# Patient Record
Sex: Female | Born: 2000 | Race: Black or African American | Hispanic: No | Marital: Single | Smoking: Never smoker
Health system: Southern US, Community
[De-identification: ages and names within clinical notes are randomized; demographics above are authoritative.]

## PROBLEM LIST (undated history)

## (undated) DIAGNOSIS — E785 Hyperlipidemia, unspecified: Secondary | ICD-10-CM

## (undated) DIAGNOSIS — F419 Anxiety disorder, unspecified: Secondary | ICD-10-CM

---
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
## 2022-11-17 IMAGING — CR DG CHEST 2V
1 series · 2 of 2 positions shown · non-contrast
Comparison: 01/22/2005

CLINICAL DATA: Chest pain, hematemesis, shortness of breath

EXAM:
CHEST - 2 VIEW

[Series 1: dg chest 2 view · 0.14mm/px · 2 of 2 slices shown]
[im 1/2]
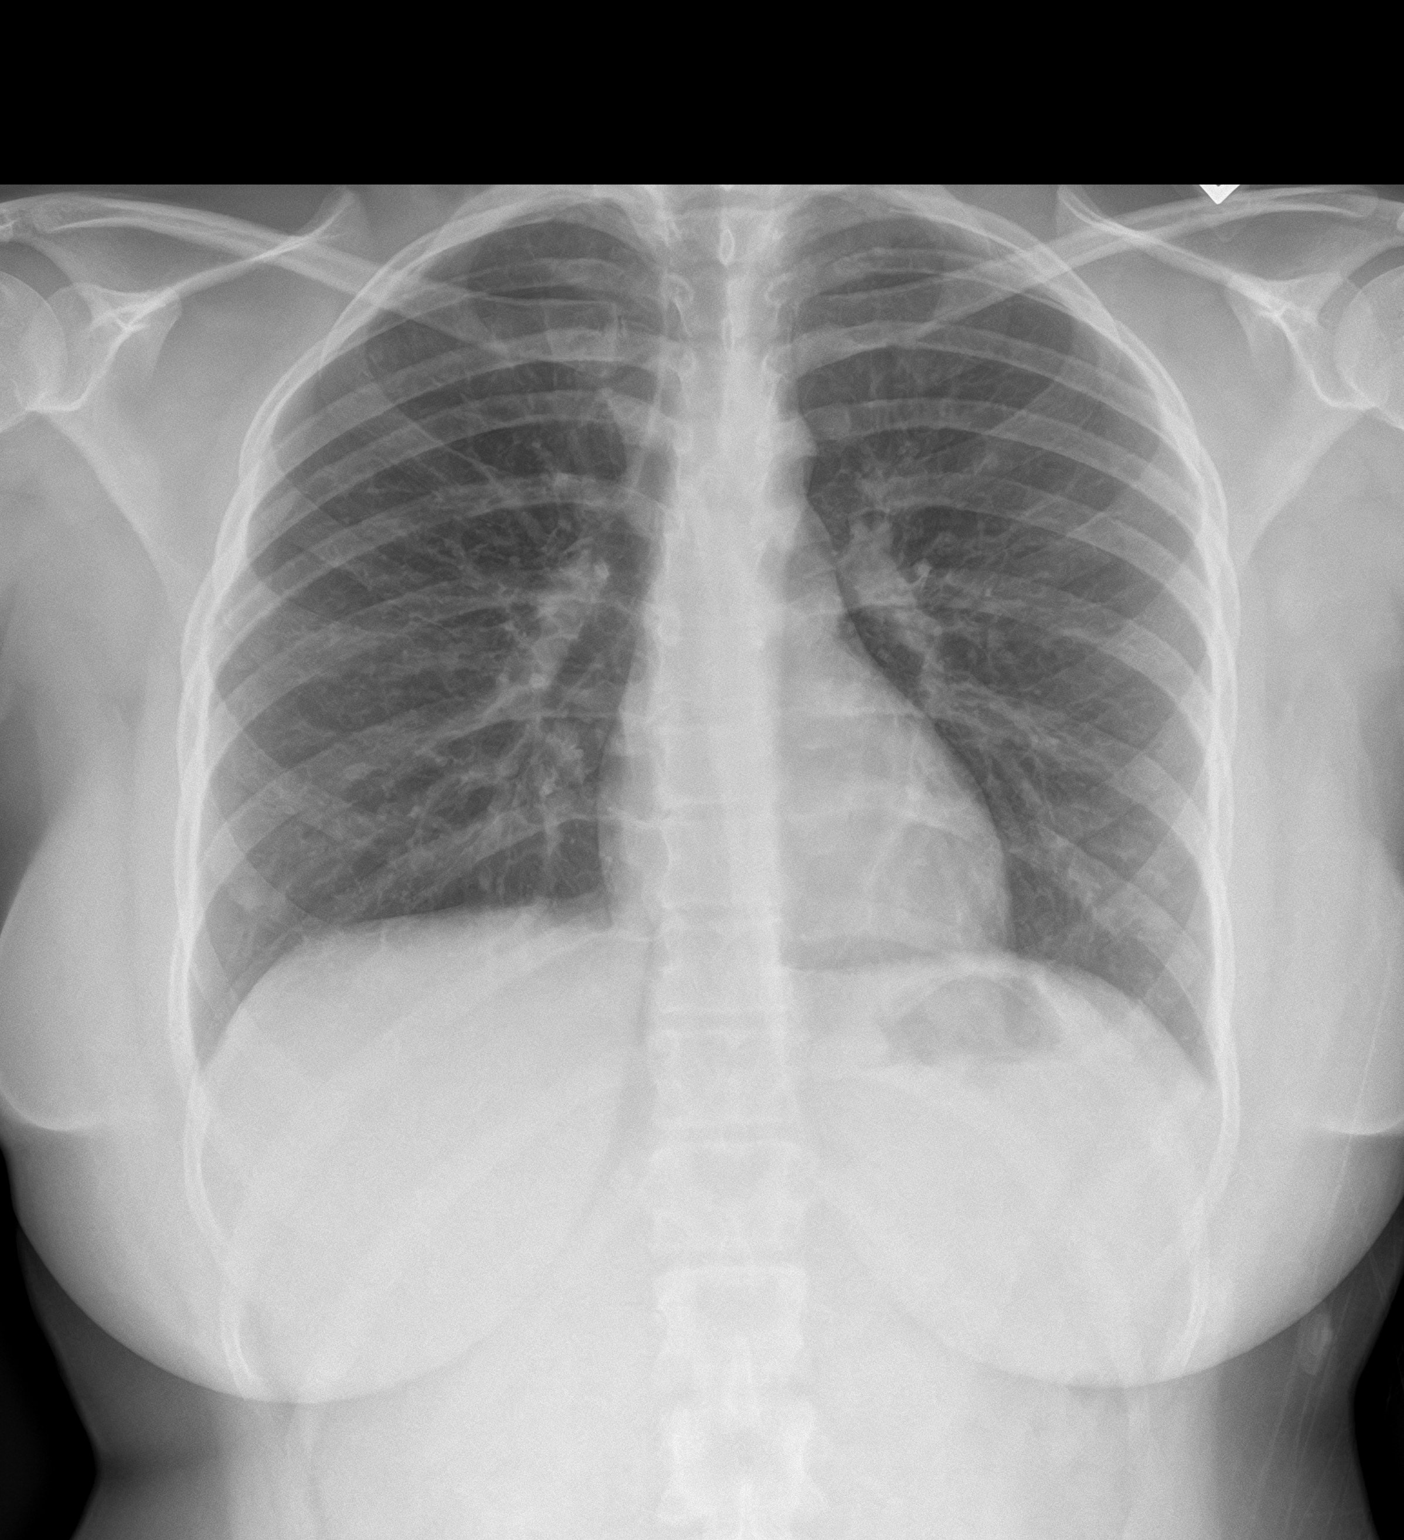
[im 2/2]
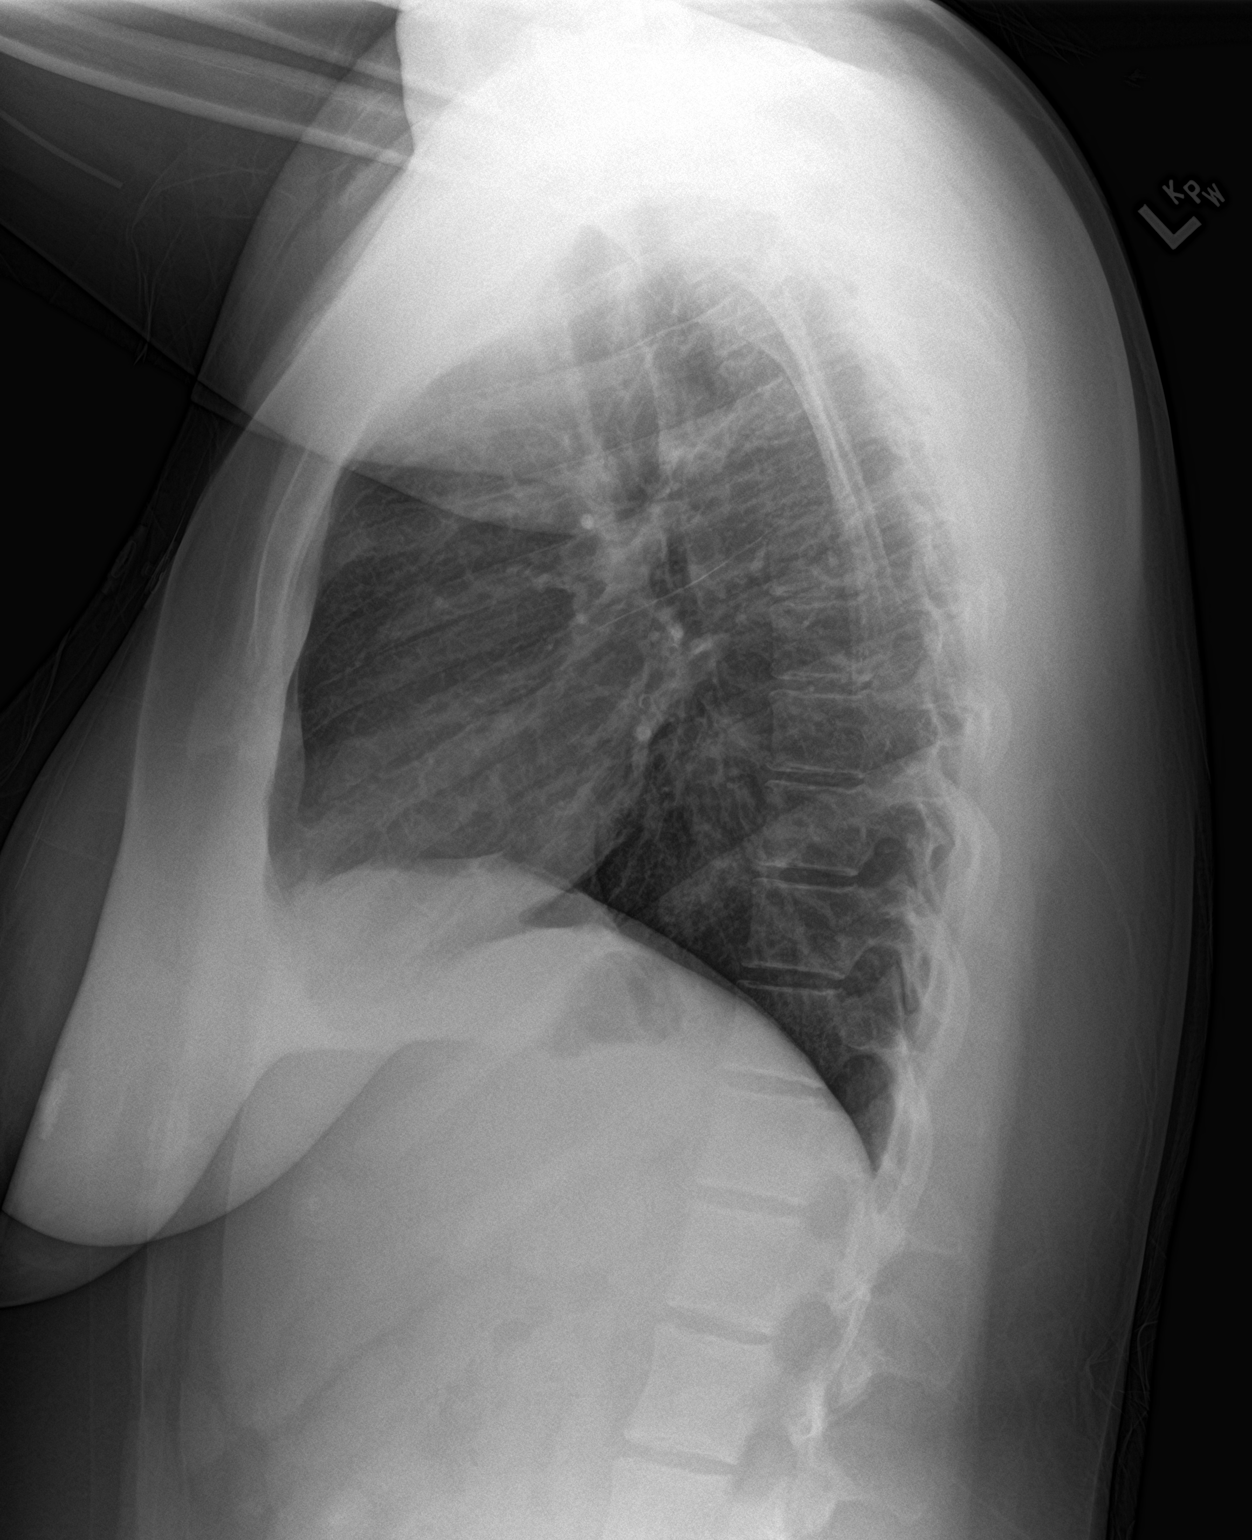

[2 of 2 positions shown; findings below may reference images not displayed]

FINDINGS: The heart size and mediastinal contours are within normal limits.
Both lungs are clear. The visualized skeletal structures are
unremarkable.
IMPRESSION: No active cardiopulmonary disease.

## 2022-11-30 ENCOUNTER — Other Ambulatory Visit: Payer: Self-pay

## 2022-11-30 ENCOUNTER — Emergency Department
Admission: EM | Admit: 2022-11-30 | Discharge: 2022-11-30 | Disposition: A | Discharge status: Home / Self Care | Payer: Medicaid Other | Attending: Emergency Medicine | Admitting: Emergency Medicine

## 2022-11-30 DIAGNOSIS — H60502 Unspecified acute noninfective otitis externa, left ear: Secondary | ICD-10-CM | POA: Diagnosis not present

## 2022-11-30 DIAGNOSIS — H9202 Otalgia, left ear: Secondary | ICD-10-CM | POA: Diagnosis present

## 2022-11-30 MED ORDER — OFLOXACIN 0.3 % OT SOLN: 5.0000 [drp] | Freq: Every day | OTIC | 0 refills | Status: AC

## 2022-11-30 NOTE — ED Triage Notes (Signed)
Pt presents to ED with c/o of L ear pain that started yesterday. NAD noted

## 2022-11-30 NOTE — ED Provider Notes (Signed)
   [facility] Provider Note    Event Date/Time   First MD Initiated Contact with Patient 11/30/22 1219     (approximate)   History   Otalgia (L ear)   HPI  Tami Tyler is a 22 y.o. female who presents with complaints of left ear x 24 hours.  No swimming recently.  No injury to the ear.  No fevers     Physical Exam   Triage Vital Signs: ED Triage Vitals [11/30/22 1149]  Encounter Vitals Group     BP 110/70     Systolic BP Percentile      Diastolic BP Percentile      Pulse Rate 87     Resp 16     Temp 99 F (37.2 C)     Temp Source Oral     SpO2 98 %     Weight 63.5 kg (140 lb)     Height 1.549 m (5\' 1" )     Head Circumference      Peak Flow      Pain Score 5     Pain Loc      Pain Education      Exclude from Growth Chart     Most recent vital signs: Vitals:   11/30/22 1149  BP: 110/70  Pulse: 87  Resp: 16  Temp: 99 F (37.2 C)  SpO2: 98%     General: Awake, no distress. Resp:  Normal effort.  Abd:  No distention.  Other:  Left ear: Erythematous inflamed external acoustic meatus, TM appears normal   ED Results / Procedures / Treatments   Labs (all labs ordered are listed, but only abnormal results are displayed) Labs Reviewed - No data to display   EKG   RADIOLOGY     PROCEDURES:  Critical Care performed:   Procedures   MEDICATIONS ORDERED IN ED: Medications - No data to display   IMPRESSION / MDM / ASSESSMENT AND PLAN / ED COURSE  I reviewed the triage vital signs and the nursing notes. Patient's presentation is most consistent with acute, uncomplicated illness.  Exam is most consistent with otitis externa, ofloxacin antibiotics provided, outpatient follow-up with PCP as needed        FINAL CLINICAL IMPRESSION(S) / ED DIAGNOSES   Final diagnoses:  Acute otitis externa of left ear, unspecified type     Rx / DC Orders   ED Discharge Orders          Ordered    ofloxacin (FLOXIN) 0.3  % OTIC solution  Daily        11/30/22 1231             Note:  This document was prepared using Dragon voice recognition software and may include unintentional dictation errors.   Jene Every, MD 11/30/22 1246

## 2023-01-20 ENCOUNTER — Encounter: Payer: Self-pay | Admitting: *Deleted

## 2023-01-21 ENCOUNTER — Ambulatory Visit: Payer: Medicaid Other | Admitting: Cardiology

## 2023-01-28 ENCOUNTER — Encounter: Payer: Self-pay | Admitting: Cardiology

## 2023-01-28 ENCOUNTER — Ambulatory Visit: Payer: Medicaid Other | Attending: Cardiology | Admitting: Cardiology

## 2023-01-28 VITALS — BP 122/74 | HR 70 | Ht 61.0 in | Wt 143.4 lb

## 2023-01-28 DIAGNOSIS — E782 Mixed hyperlipidemia: Secondary | ICD-10-CM | POA: Diagnosis not present

## 2023-01-28 NOTE — Patient Instructions (Signed)
Medication Instructions:   Your physician recommends that you continue on your current medications as directed. Please refer to the Current Medication list given to you today.  *If you need a refill on your cardiac medications before your next appointment, please call your pharmacy*   Lab Work:  1, your physician recommends you have labs - LIPID  If you have labs (blood work) drawn today and your tests are completely normal, you will receive your results only by: MyChart Message (if you have MyChart) OR A paper copy in the mail If you have any lab test that is abnormal or we need to change your treatment, we will call you to review the results.   Testing/Procedures:  None Ordered   Follow-Up: At [facility], you and your health needs are our priority.  As part of our continuing mission to provide you with exceptional heart care, we have created designated Provider Care Teams.  These Care Teams include your primary Cardiologist (physician) and Advanced Practice Providers (APPs -  Physician Assistants and Nurse Practitioners) who all work together to provide you with the care you need, when you need it.  We recommend signing up for the patient portal called "MyChart".  Sign up information is provided on this After Visit Summary.  MyChart is used to connect with patients for Virtual Visits (Telemedicine).  Patients are able to view lab/test results, encounter notes, upcoming appointments, etc.  Non-urgent messages can be sent to your provider as well.   To learn more about what you can do with MyChart, go to [email or web address].    Your next appointment:    As needed

## 2023-01-28 NOTE — Progress Notes (Signed)
Cardiology Office Note:    Date:  01/28/2023   ID:  Tami Tyler, DOB Jul 23, 2000, MRN [id number]  PCP:  Erick Colace, MD   [facility] Providers Cardiologist:  None     Referring MD: Erick Colace, MD   Chief Complaint  Patient presents with   Follow-up    Patient denies new or acute cardiac problems/concerns today.      History of Present Illness:    Tami Tyler is a 22 y.o. female with a hx of anxiety, hyperlipidemia who presents for follow-up.    Being seen for hyperlipidemia, started on Lipitor 40 mg daily with good effect.  Cholesterol adequately controlled, tolerating medications with no adverse effects.  Feels well, no concerns at this time.  Prior notes Previous lipid panel noted to be abnormal with total cholesterol 273, LDL cholesterol 260.    States having younger siblings with cholesterol slightly elevated for the age.  She is unsure if parents have high cholesterol.    Past Medical History:  Diagnosis Date   Anxiety    Hyperlipidemia    Wisdom teeth extracted 02/2019    Past Surgical History:  Procedure Laterality Date   HYMENECTOMY N/A 02/12/2021   Procedure: HYMENECTOMY;  Surgeon: Natale Milch, MD;  Location: [facility] ORS;  Service: Gynecology;  Laterality: N/A;    Current Medications: No outpatient medications have been marked as taking for the 01/28/23 encounter (Office Visit) with Debbe Odea, MD.     Allergies:   Patient has no known allergies.   Social History   Socioeconomic History   Marital status: Single    Spouse name: Not on file   Number of children: Not on file   Years of education: Not on file   Highest education level: Not on file  Occupational History   Not on file  Tobacco Use   Smoking status: Never   Smokeless tobacco: Never  Vaping Use   Vaping status: Never Used  Substance and Sexual Activity   Alcohol use: No   Drug use: Not Currently    Types: Marijuana    Comment: last time in July    Sexual activity: Not Currently    Birth control/protection: Implant  Other Topics Concern   Not on file  Social History Narrative   Not on file   Social Determinants of Health   Financial Resource Strain: Not on file  Food Insecurity: Not on file  Transportation Needs: Not on file  Physical Activity: Not on file  Stress: Not on file  Social Connections: Not on file     Family History: The patient's family history includes Diabetes in her father; Healthy in her mother; Hypertension in her father.  ROS:   Please see the history of present illness.     All other systems reviewed and are negative.  EKGs/Labs/Other Studies Reviewed:    The following studies were reviewed today:   EKG Interpretation Date/Time:  Wednesday January 28 2023 09:43:29 EST Ventricular Rate:  70 PR Interval:  112 QRS Duration:  86 QT Interval:  382 QTC Calculation: 412 R Axis:   64  Text Interpretation: Normal sinus rhythm with sinus arrhythmia Normal ECG Confirmed by Debbe Odea ([id number]) on 01/28/2023 9:56:53 AM    Recent Labs: No results found for requested labs within last 365 days.  Recent Lipid Panel    Component Value Date/Time   CHOL 152 05/21/2021 0927   TRIG 66 05/21/2021 0927   HDL 39 (L) 05/21/2021 2355  CHOLHDL 3.9 05/21/2021 0927   CHOLHDL 5.9 12/24/2020 0941   VLDL 32 12/24/2020 0941   LDLCALC 100 05/21/2021 0927     Risk Assessment/Calculations:          Physical Exam:    VS:  BP 122/74 (BP Location: Left Arm, Patient Position: Sitting, Cuff Size: Normal)   Pulse 70   Ht 5\' 1"  (1.549 m)   Wt 143 lb 6.4 oz (65 kg)   SpO2 100%   BMI 27.10 kg/m     Wt Readings from Last 3 Encounters:  01/28/23 143 lb 6.4 oz (65 kg)  11/30/22 140 lb (63.5 kg)  06/07/21 152 lb (68.9 kg) (84%, Z= 0.98)*   * Growth percentiles are based on CDC (Girls, 2-20 Years) data.     GEN:  Well nourished, well developed in no acute distress HEENT: Normal NECK: No JVD; No carotid  bruits CARDIAC: RRR, no murmurs, rubs, gallops RESPIRATORY:  Clear to auscultation without rales, wheezing or rhonchi  ABDOMEN: Soft, non-tender, non-distended MUSCULOSKELETAL:  No edema; No deformity  SKIN: Warm and dry NEUROLOGIC:  Alert and oriented x 3 PSYCHIATRIC:  Normal affect   ASSESSMENT:    1. Mixed hyperlipidemia    PLAN:    In order of problems listed above:  Hyperlipidemia, prior total cholesterol 273, LDL 216.  Likely familial, cholesterol controlled with Lipitor 40 mg daily.  Obtain lipid panel today.  Genetic testing previously not covered by insurance.  Continue Lipitor 40 mg daily.  Follow-up as needed if lipid panel is normal.     Medication Adjustments/Labs and Tests Ordered: Current medicines are reviewed at length with the patient today.  Concerns regarding medicines are outlined above.  Orders Placed This Encounter  Procedures   Lipid panel   EKG 12-Lead     No orders of the defined types were placed in this encounter.     Patient Instructions  Medication Instructions:   Your physician recommends that you continue on your current medications as directed. Please refer to the Current Medication list given to you today.  *If you need a refill on your cardiac medications before your next appointment, please call your pharmacy*   Lab Work:  1, your physician recommends you have labs - LIPID  If you have labs (blood work) drawn today and your tests are completely normal, you will receive your results only by: MyChart Message (if you have MyChart) OR A paper copy in the mail If you have any lab test that is abnormal or we need to change your treatment, we will call you to review the results.   Testing/Procedures:  None Ordered   Follow-Up: At [facility], you and your health needs are our priority.  As part of our continuing mission to provide you with exceptional heart care, we have created designated Provider Care Teams.  These  Care Teams include your primary Cardiologist (physician) and Advanced Practice Providers (APPs -  Physician Assistants and Nurse Practitioners) who all work together to provide you with the care you need, when you need it.  We recommend signing up for the patient portal called "MyChart".  Sign up information is provided on this After Visit Summary.  MyChart is used to connect with patients for Virtual Visits (Telemedicine).  Patients are able to view lab/test results, encounter notes, upcoming appointments, etc.  Non-urgent messages can be sent to your provider as well.   To learn more about what you can do with MyChart, go to [email or web address].  Your next appointment:    As needed    Signed, Debbe Odea, MD  01/28/2023 11:11 AM    [facility]

## 2023-01-29 LAB — LIPID PANEL
Chol/HDL Ratio: 4.9 ratio — ABNORMAL HIGH (ref 0.0–4.4)
Cholesterol, Total: 305 mg/dL — ABNORMAL HIGH (ref 100–199)
HDL: 62 mg/dL (ref >39)
LDL Chol Calc (NIH): 230 mg/dL — ABNORMAL HIGH (ref 0–99)
Triglycerides: 82 mg/dL (ref 0–149)
VLDL Cholesterol Cal: 13 mg/dL (ref 5–40)

## 2024-04-29 ENCOUNTER — Ambulatory Visit

## 2024-05-30 ENCOUNTER — Ambulatory Visit: Admitting: Family Medicine
